# Patient Record
Sex: Male | Born: 1975 | Hispanic: Yes | Marital: Single | Smoking: Never smoker
Health system: Southern US, Community
[De-identification: ages and names within clinical notes are randomized; demographics above are authoritative.]

---

## 2014-07-26 ENCOUNTER — Ambulatory Visit: Payer: Self-pay | Admitting: Family Medicine

## 2015-03-18 ENCOUNTER — Other Ambulatory Visit: Payer: Self-pay | Admitting: Family Medicine

## 2015-03-18 ENCOUNTER — Ambulatory Visit
Admission: RE | Admit: 2015-03-18 | Discharge: 2015-03-18 | Disposition: A | Discharge status: Home / Self Care | Payer: No Typology Code available for payment source | Source: Ambulatory Visit | Attending: Family Medicine | Admitting: Family Medicine

## 2015-03-18 DIAGNOSIS — R011 Cardiac murmur, unspecified: Secondary | ICD-10-CM | POA: Insufficient documentation

## 2015-03-18 NOTE — Progress Notes (Signed)
*  PRELIMINARY RESULTS* Echocardiogram 2D Echocardiogram has been performed.  Tony HousekeeperJerry R Leblanc 03/18/2015, 2:17 PM

## 2016-03-28 IMAGING — CT CT ABDOMEN WO/W CM
2 of 10 series · 11 of 46 positions shown, 17 images · IV contrast (agent unspecified)
Comparison: None.

CLINICAL DATA: Intermittent gross hematuria with low abdominal pain
(worse on the right) for 1 month. No acute injury or history of
malignancy. Initial encounter.

EXAM:
CT ABDOMEN WITHOUT AND WITH CONTRAST
TECHNIQUE: Multidetector CT imaging of the abdomen was performed following the
standard protocol before and following the bolus administration of
intravenous contrast.
CONTRAST:  100 ml 2sovue-BNN.

[Series 9: renal without cor · coronal · non-contrast · 0.51mm/px · 2 of 129 slices shown]
[im 43/129  soft-tissue]
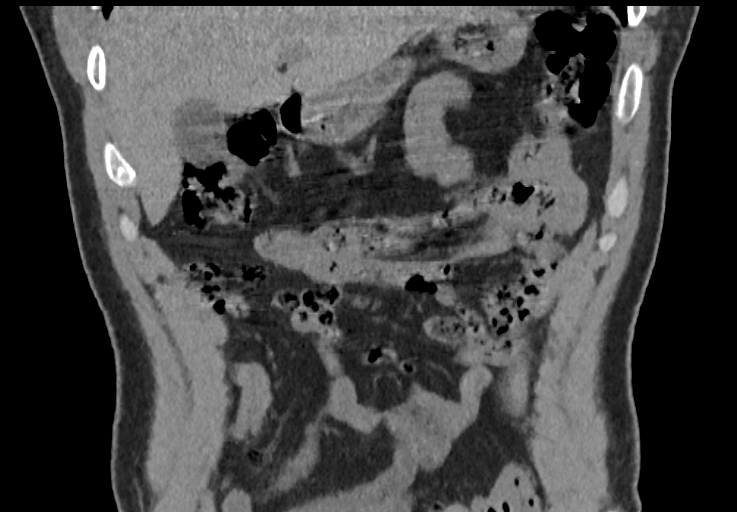
[im 86/129  soft-tissue]
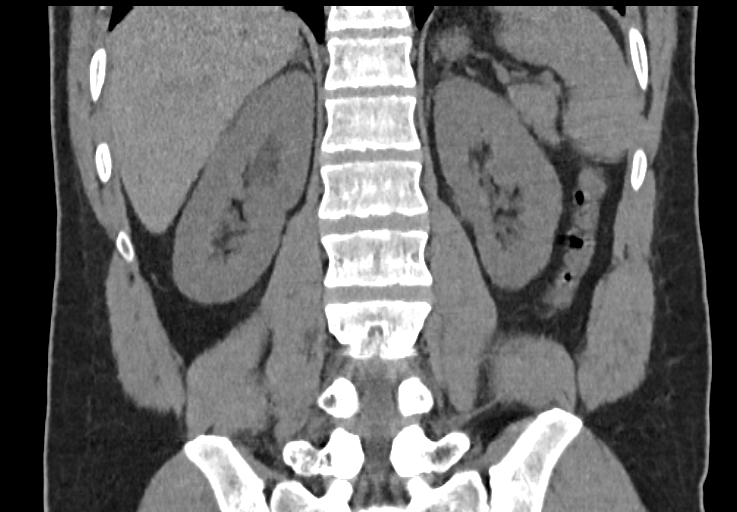

[Series 15: renal nephrographic_ · axial · 0.68mm/px · z∈[-848,-624]mm · 9 of 141 slices shown, 15 images]
[im 15/141  soft-tissue]
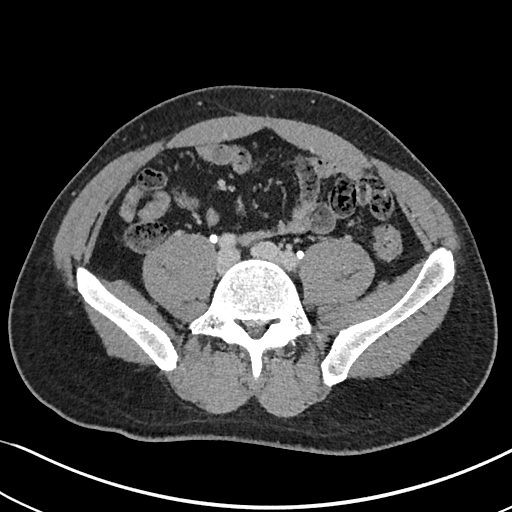
[im 15/141  bone]
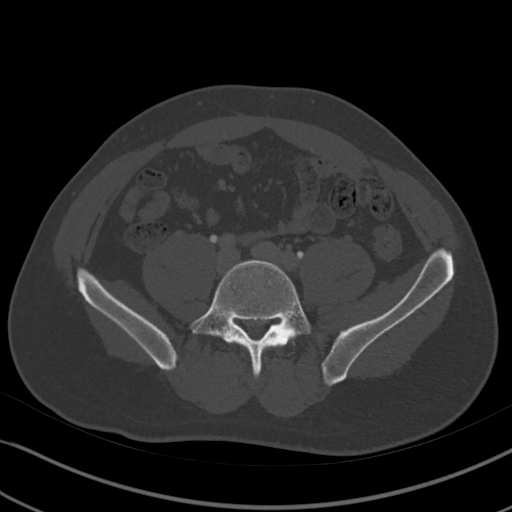
[im 29/141  soft-tissue]
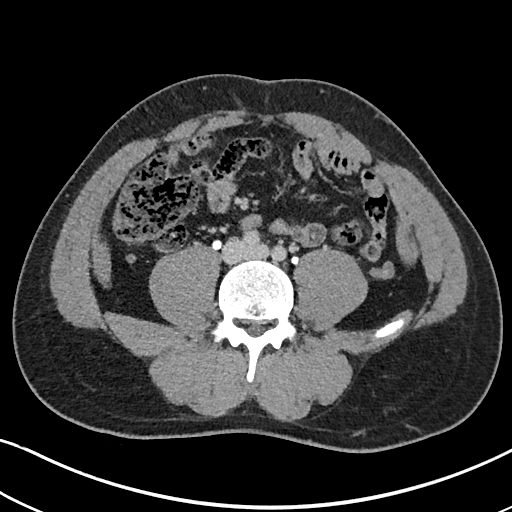
[im 43/141  soft-tissue]
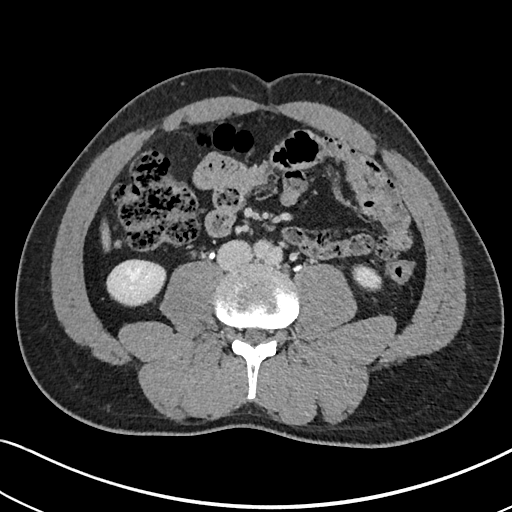
[im 57/141  soft-tissue]
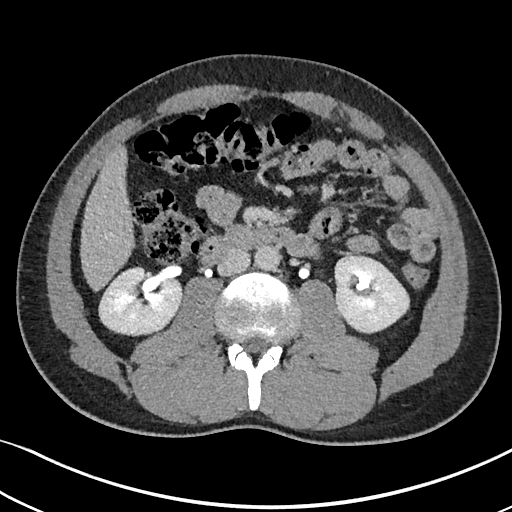
[im 71/141  soft-tissue]
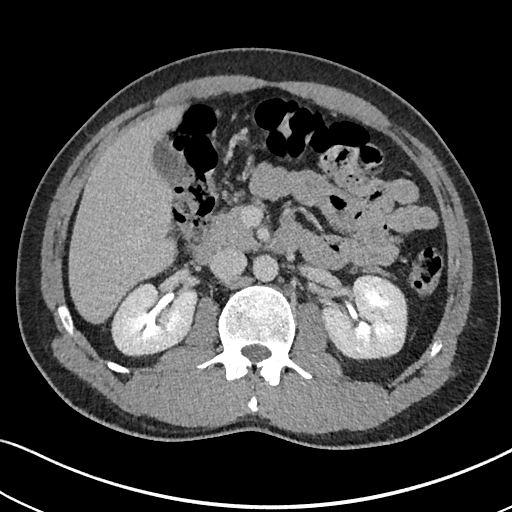
[im 85/141  soft-tissue]
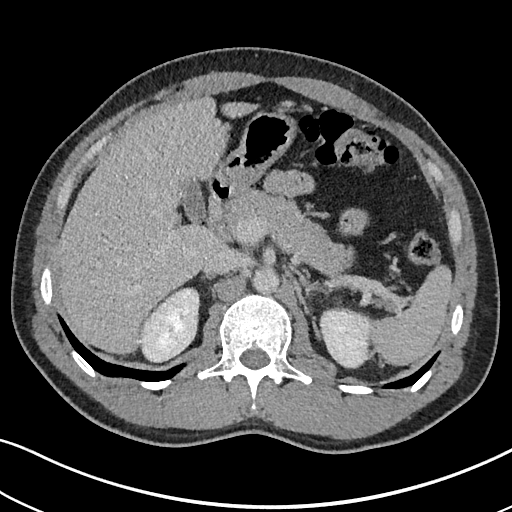
[im 85/141  lung]
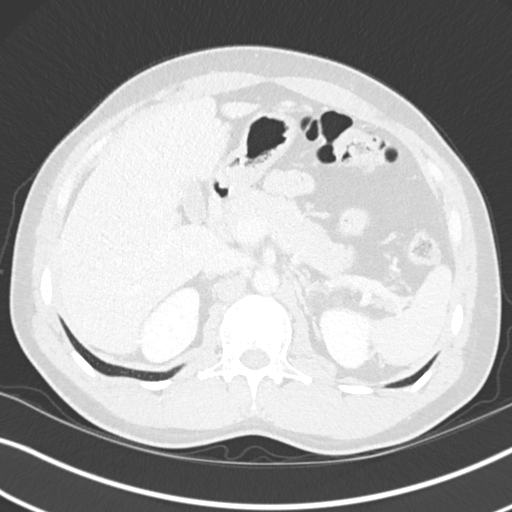
[im 99/141  soft-tissue]
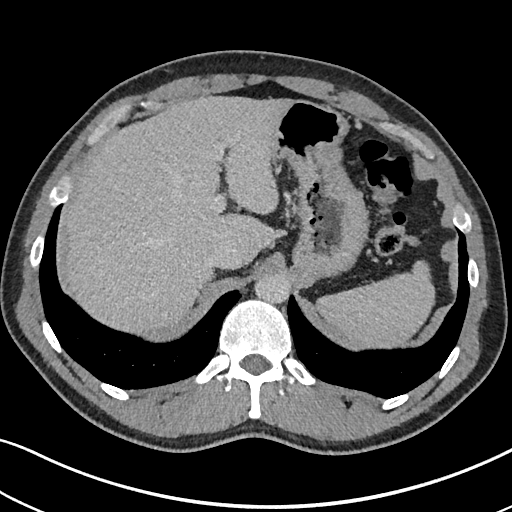
[im 99/141  lung]
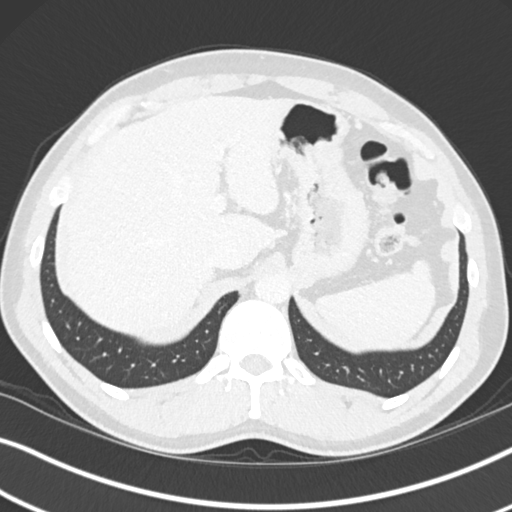
[im 113/141  soft-tissue]
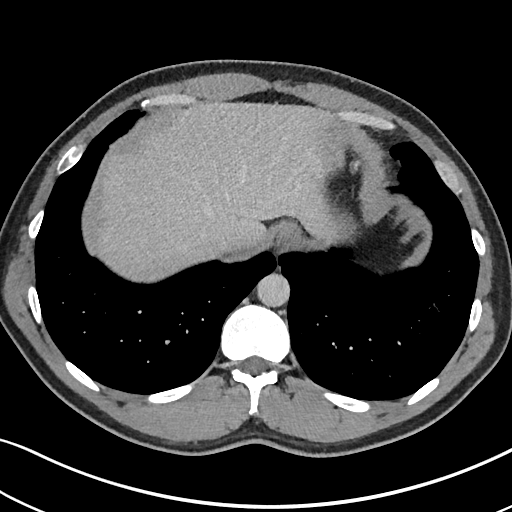
[im 113/141  lung]
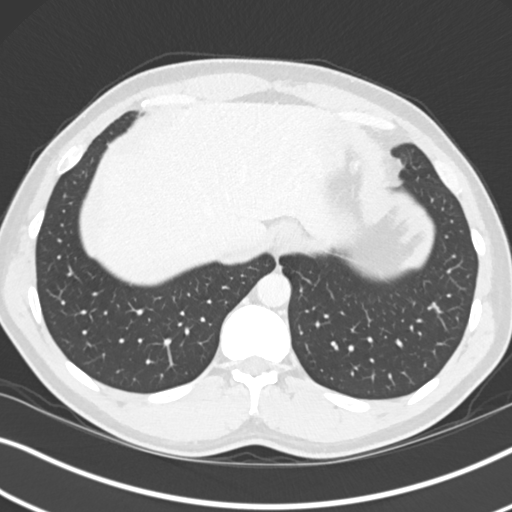
[im 127/141  soft-tissue]
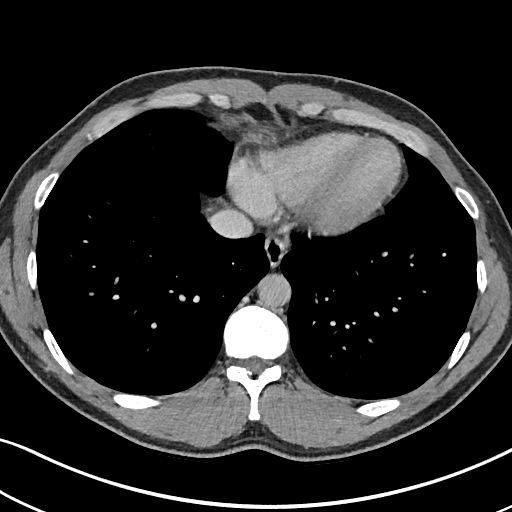
[im 127/141  lung]
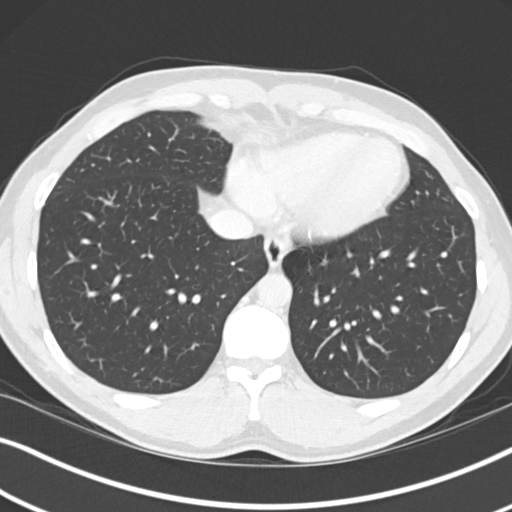
[im 127/141  bone]
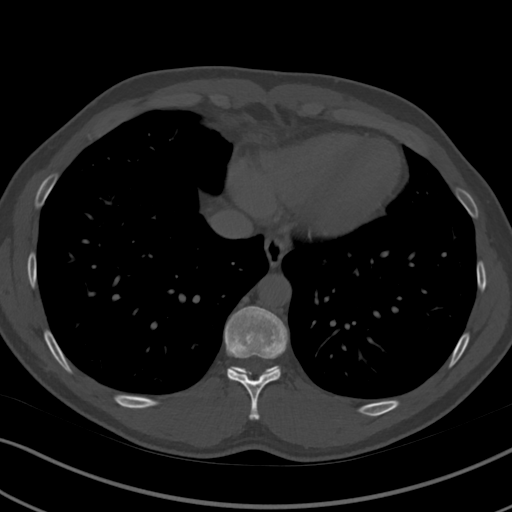

[11 of 46 positions shown; findings below may reference images not displayed]

FINDINGS: Lower chest: Clear lung bases. No significant pleural or pericardial
effusion.

Hepatobiliary: The liver is normal in density without focal
abnormality. No evidence of gallstones, gallbladder wall thickening
or biliary dilatation.

Pancreas: Unremarkable. No pancreatic ductal dilatation or
surrounding inflammatory changes.

Spleen: Normal in size without focal abnormality.

Adrenals/Urinary Tract: Both adrenal glands appear normal.There is a
2 mm calculus in the lower pole of the right kidney, best seen on
the reformatted images. No other renal or proximal ureteral calculi
are demonstrated. There is no hydronephrosis post-contrast, both
kidneys enhance normally. There is no evidence of renal mass.
Delayed images demonstrate no evidence of urothelial lesion. The
right ureter is partially duplicated. The bladder was not imaged.

Stomach/Bowel: No evidence of bowel wall thickening, distention or
surrounding inflammatory change.The stomach is incompletely
distended. The appendix appears normal. The pelvis was not imaged.

Vascular/Lymphatic: There are no enlarged abdominal lymph nodes. No
significant vascular findings are present.

Other: No evidence of abdominal wall mass or hernia.

Musculoskeletal: No acute or significant osseous findings.
Congenitally short lumbar pedicles noted.
IMPRESSION: 1. Single small nonobstructing right renal calculus. No evidence of
ureteral calculus or hydronephrosis. The distal ureters and bladder
are not imaged.
2. No other explanation for hematuria identified.
3. No acute abdominal findings.

## 2017-08-26 ENCOUNTER — Encounter: Payer: Self-pay | Admitting: Urology

## 2017-08-26 ENCOUNTER — Ambulatory Visit: Payer: No Typology Code available for payment source | Admitting: Urology

## 2017-08-26 VITALS — BP 137/85 | HR 98 | Ht 67.0 in | Wt 197.0 lb

## 2017-08-26 DIAGNOSIS — R300 Dysuria: Secondary | ICD-10-CM | POA: Diagnosis not present

## 2017-08-26 DIAGNOSIS — R3129 Other microscopic hematuria: Secondary | ICD-10-CM | POA: Diagnosis not present

## 2017-08-26 LAB — URINALYSIS, COMPLETE
Bilirubin, UA: NEGATIVE
GLUCOSE, UA: NEGATIVE
LEUKOCYTES UA: NEGATIVE
Nitrite, UA: NEGATIVE
Specific Gravity, UA: 1.015 (ref 1.005–1.030)
Urobilinogen, Ur: 2 mg/dL — ABNORMAL HIGH (ref 0.2–1.0)
pH, UA: 7 (ref 5.0–7.5)

## 2017-08-26 LAB — MICROSCOPIC EXAMINATION
BACTERIA UA: NONE SEEN
Epithelial Cells (non renal): NONE SEEN /hpf (ref 0–10)
WBC, UA: NONE SEEN /hpf (ref 0–?)

## 2017-08-26 NOTE — Progress Notes (Signed)
08/26/2017 3:37 PM   Dawayne CirriJaime Duplechain 07/19/1976 [id number]  Referring provider: Jeannie DonePolanco, Leonard F, MD [address] [place], [place] [id number]  Chief Complaint  Patient presents with  . Hematuria    HPI: The patient is a 41 year old gentleman who presents today to discuss his dysuria and hematuria.    1. Dysuria He was recently seen in his primary care provider's office and was started on doxycycline.  That time he was having burning in his urethra during erections.  GC and chlamydia as well as urinalysis were negative for infection.  This has since resolved after course of doxycycline.  He has never had this problem before.  He feels he is almost better.  He is nowhere concerned by this.  2.  Microscopic hematuria The patient was noted to have blood in his urine at his PCPs office during the above event.  He continues to have microscopic hematuria today with his symptoms have now resolved.  He has no history of nephrolithiasis or smoking.  He denies chemical exposures.   PMH: History reviewed. No pertinent past medical history.  Surgical History: History reviewed. No pertinent surgical history.  Home Medications:  Allergies as of 08/26/2017   No Known Allergies     Medication List    as of 08/26/2017  3:37 PM   You have not been prescribed any medications.     Allergies: No Known Allergies  Family History: Family History  Problem Relation Age of Onset  . Bladder Cancer Neg Hx   . Kidney cancer Neg Hx     Social History:  reports that  has never smoked. he has never used smokeless tobacco. His alcohol and drug histories are not on file.  ROS: UROLOGY Frequent Urination?: Yes Hard to postpone urination?: No Burning/pain with urination?: Yes Get up at night to urinate?: Yes Leakage of urine?: No Urine stream starts and stops?: Yes Trouble starting stream?: No Do you have to strain to urinate?: No Blood in urine?: No Urinary tract infection?: No Sexually  transmitted disease?: No Injury to kidneys or bladder?: No Painful intercourse?: Yes Weak stream?: No Erection problems?: No Penile pain?: No  Gastrointestinal Nausea?: No Vomiting?: No Indigestion/heartburn?: No Diarrhea?: No Constipation?: Yes  Constitutional Fever: No Night sweats?: No Weight loss?: No Fatigue?: No  Skin Skin rash/lesions?: No Itching?: No  Eyes Blurred vision?: No Double vision?: No  Ears/Nose/Throat Sore throat?: No Sinus problems?: No  Hematologic/Lymphatic Swollen glands?: No Easy bruising?: No  Cardiovascular Leg swelling?: No Chest pain?: No  Respiratory Cough?: No Shortness of breath?: No  Endocrine Excessive thirst?: No  Musculoskeletal Back pain?: No Joint pain?: No  Neurological Headaches?: No Dizziness?: No  Psychologic Depression?: No Anxiety?: No  Physical Exam: BP 137/85 (BP Location: Right Arm, Patient Position: Sitting, Cuff Size: Normal)   Pulse 98   Ht 5\' 7"  (1.702 m)   Wt 197 lb (89.4 kg)   BMI 30.85 kg/m   Constitutional:  Alert and oriented, No acute distress. HEENT: [place] AT, moist mucus membranes.  Trachea midline, no masses. Cardiovascular: No clubbing, cyanosis, or edema. Respiratory: Normal respiratory effort, no increased work of breathing. GI: Abdomen is soft, nontender, nondistended, no abdominal masses GU: No CVA tenderness.  Skin: No rashes, bruises or suspicious lesions. Lymph: No cervical or inguinal adenopathy. Neurologic: Grossly intact, no focal deficits, moving all 4 extremities. Psychiatric: Normal mood and affect.  Laboratory Data: No results found for: WBC, HGB, HCT, MCV, PLT  No results found for: CREATININE  No results found for: PSA  No results found for: TESTOSTERONE  No results found for: HGBA1C  Urinalysis No results found for: COLORURINE, APPEARANCEUR, LABSPEC, PHURINE, GLUCOSEU, HGBUR, BILIRUBINUR, KETONESUR, PROTEINUR, UROBILINOGEN, NITRITE,  LEUKOCYTESUR   Assessment & Plan:    1. Microscopic hematuria I have recommended to the patient he undergo hematuria workup with a CT urogram followed by office cystoscopy.  Complicating matters at this time is that he is uninsured.  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  He will call the office once his insurance is activated to undergo these studies.  2.  Dysuria This has since resolved.  No further workup needed.   Hildred LaserBrian James Chukwuemeka Artola, MD  [facility] [address], [address] [place], [place] [address] [phone]
